# Patient Record
Sex: Female | Born: 1995 | Race: White | Hispanic: No | Marital: Single | Smoking: Never smoker
Health system: Southern US, Community
[De-identification: ages and names within clinical notes are randomized; demographics above are authoritative.]

## PROBLEM LIST (undated history)

## (undated) ENCOUNTER — Inpatient Hospital Stay: Payer: Self-pay

## (undated) DIAGNOSIS — F329 Major depressive disorder, single episode, unspecified: Secondary | ICD-10-CM

## (undated) DIAGNOSIS — D580 Hereditary spherocytosis: Secondary | ICD-10-CM

## (undated) DIAGNOSIS — O1490 Unspecified pre-eclampsia, unspecified trimester: Secondary | ICD-10-CM

## (undated) DIAGNOSIS — F32A Depression, unspecified: Secondary | ICD-10-CM

## (undated) HISTORY — DX: Unspecified pre-eclampsia, unspecified trimester: O14.90

## (undated) HISTORY — PX: SPLENECTOMY, TOTAL: SHX788

---
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
## 2017-01-22 ENCOUNTER — Emergency Department: Payer: Medicaid Other

## 2017-01-22 ENCOUNTER — Emergency Department
Admission: EM | Admit: 2017-01-22 | Discharge: 2017-01-22 | Disposition: A | Discharge status: Home / Self Care | Payer: Medicaid Other | Attending: Emergency Medicine | Admitting: Emergency Medicine

## 2017-01-22 ENCOUNTER — Encounter: Payer: Self-pay | Admitting: *Deleted

## 2017-01-22 DIAGNOSIS — S5012XA Contusion of left forearm, initial encounter: Secondary | ICD-10-CM

## 2017-01-22 DIAGNOSIS — Y929 Unspecified place or not applicable: Secondary | ICD-10-CM | POA: Diagnosis not present

## 2017-01-22 DIAGNOSIS — S50812A Abrasion of left forearm, initial encounter: Secondary | ICD-10-CM

## 2017-01-22 DIAGNOSIS — S4992XA Unspecified injury of left shoulder and upper arm, initial encounter: Secondary | ICD-10-CM | POA: Diagnosis present

## 2017-01-22 DIAGNOSIS — Y998 Other external cause status: Secondary | ICD-10-CM | POA: Diagnosis not present

## 2017-01-22 DIAGNOSIS — Z23 Encounter for immunization: Secondary | ICD-10-CM | POA: Diagnosis not present

## 2017-01-22 DIAGNOSIS — Z793 Long term (current) use of hormonal contraceptives: Secondary | ICD-10-CM | POA: Insufficient documentation

## 2017-01-22 DIAGNOSIS — Y9389 Activity, other specified: Secondary | ICD-10-CM | POA: Diagnosis not present

## 2017-01-22 DIAGNOSIS — S40012A Contusion of left shoulder, initial encounter: Secondary | ICD-10-CM | POA: Diagnosis not present

## 2017-01-22 LAB — POCT PREGNANCY, URINE: Preg Test, Ur: NEGATIVE

## 2017-01-22 MED ORDER — TETANUS-DIPHTH-ACELL PERTUSSIS 5-2.5-18.5 LF-MCG/0.5 IM SUSP
0.5000 mL | Freq: Once | INTRAMUSCULAR | Status: AC
  Administered 2017-01-22: 0.5 mL via INTRAMUSCULAR
  Filled 2017-01-22: qty 0.5

## 2017-01-22 MED ORDER — HYDROCODONE-ACETAMINOPHEN 5-325 MG PO TABS: 1.0000 | ORAL_TABLET | ORAL | 0 refills | Status: DC | PRN

## 2017-01-22 MED ORDER — HYDROCODONE-ACETAMINOPHEN 5-325 MG PO TABS
2.0000 | ORAL_TABLET | Freq: Once | ORAL | Status: AC
  Administered 2017-01-22: 2 via ORAL
  Filled 2017-01-22: qty 2

## 2017-01-22 MED ORDER — BACITRACIN-NEOMYCIN-POLYMYXIN 400-5-5000 EX OINT
TOPICAL_OINTMENT | CUTANEOUS | Status: AC
  Filled 2017-01-22: qty 1

## 2017-01-22 MED ORDER — IBUPROFEN 600 MG PO TABS: 600.0000 mg | ORAL_TABLET | Freq: Three times a day (TID) | ORAL | 0 refills | Status: DC | PRN
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
## 2017-01-22 NOTE — Discharge Instructions (Signed)
Clean abrasions daily with mild soap and water. Watch for signs of infection. Ice to the forearm and shoulder as needed for pain and any soft tissue swelling. Ibuprofen as needed for inflammation and pain. Norco if needed for moderate pain. [facility] if any continued problems.

## 2017-05-19 ENCOUNTER — Encounter: Payer: Self-pay | Admitting: Emergency Medicine

## 2017-05-19 ENCOUNTER — Other Ambulatory Visit: Payer: Self-pay

## 2017-05-19 ENCOUNTER — Emergency Department
Admission: EM | Admit: 2017-05-19 | Discharge: 2017-05-19 | Disposition: A | Discharge status: Home / Self Care | Payer: Medicaid Other | Attending: Emergency Medicine | Admitting: Emergency Medicine

## 2017-05-19 DIAGNOSIS — J029 Acute pharyngitis, unspecified: Secondary | ICD-10-CM | POA: Diagnosis not present

## 2017-05-19 DIAGNOSIS — G44039 Episodic paroxysmal hemicrania, not intractable: Secondary | ICD-10-CM

## 2017-05-19 DIAGNOSIS — Z79899 Other long term (current) drug therapy: Secondary | ICD-10-CM | POA: Diagnosis not present

## 2017-05-19 DIAGNOSIS — G44049 Chronic paroxysmal hemicrania, not intractable: Secondary | ICD-10-CM | POA: Insufficient documentation

## 2017-05-19 DIAGNOSIS — R509 Fever, unspecified: Secondary | ICD-10-CM | POA: Insufficient documentation

## 2017-05-19 DIAGNOSIS — R51 Headache: Secondary | ICD-10-CM | POA: Diagnosis present

## 2017-05-19 LAB — BASIC METABOLIC PANEL
Anion gap: 6 (ref 5–15)
BUN: 10 mg/dL (ref 6–20)
CALCIUM: 8.5 mg/dL — AB (ref 8.9–10.3)
CO2: 25 mmol/L (ref 22–32)
CREATININE: 0.54 mg/dL (ref 0.44–1.00)
Chloride: 105 mmol/L (ref 101–111)
Glucose, Bld: 124 mg/dL — ABNORMAL HIGH (ref 65–99)
Potassium: 3.2 mmol/L — ABNORMAL LOW (ref 3.5–5.1)
SODIUM: 136 mmol/L (ref 135–145)

## 2017-05-19 LAB — CBC
HEMATOCRIT: 32.6 % — AB (ref 35.0–47.0)
Hemoglobin: 11.4 g/dL — ABNORMAL LOW (ref 12.0–16.0)
MCH: 29.1 pg (ref 26.0–34.0)
MCHC: 34.9 g/dL (ref 32.0–36.0)
MCV: 83.4 fL (ref 80.0–100.0)
PLATELETS: 468 10*3/uL — AB (ref 150–440)
RBC: 3.91 MIL/uL (ref 3.80–5.20)
RDW: 13.3 % (ref 11.5–14.5)
WBC: 24.5 10*3/uL — AB (ref 3.6–11.0)

## 2017-05-19 LAB — POCT RAPID STREP A: STREPTOCOCCUS, GROUP A SCREEN (DIRECT): NEGATIVE

## 2017-05-19 LAB — MONONUCLEOSIS SCREEN: MONO SCREEN: NEGATIVE

## 2017-05-19 MED ORDER — ACETAMINOPHEN 500 MG PO TABS
1000.0000 mg | ORAL_TABLET | Freq: Once | ORAL | Status: AC
  Administered 2017-05-19: 1000 mg via ORAL
  Filled 2017-05-19: qty 2

## 2017-05-19 MED ORDER — AMOXICILLIN 875 MG PO TABS: 875.0000 mg | ORAL_TABLET | Freq: Two times a day (BID) | ORAL | 0 refills | Status: DC

## 2017-05-19 MED ORDER — SODIUM CHLORIDE 0.9 % IV BOLUS (SEPSIS)
1000.0000 mL | Freq: Once | INTRAVENOUS | Status: AC
  Administered 2017-05-19: 1000 mL via INTRAVENOUS

## 2017-05-19 MED ORDER — CEFTRIAXONE SODIUM IN DEXTROSE 20 MG/ML IV SOLN
1.0000 g | Freq: Once | INTRAVENOUS | Status: AC
  Administered 2017-05-19: 1 g via INTRAVENOUS
  Filled 2017-05-19: qty 50

## 2017-05-19 NOTE — ED Triage Notes (Addendum)
Patient to ER for c/o severe headache to right posterior head that started 3 days ago. Patient reports she has never been diagnosed with migraines, but had headaches a lot as a kid. Patient reports taking Dayquil and IBU last at 1900. Patient states she is unaware if she has had fevers at home. States she has been taking several OTC medications with no relief of headache. Denies photosensitivity, but reports sensitivity to noise. Last dose of Tylenol was at 0500.

## 2017-05-19 NOTE — Discharge Instructions (Signed)
PLease follow up with your primary care physician °

## 2017-05-19 NOTE — ED Notes (Signed)
Pt alert and oriented X4, active, cooperative, pt in NAD. RR even and unlabored, color WNL.  Pt informed to return if any life threatening symptoms occur.  Discharge and followup instructions reviewed.  

## 2017-05-19 NOTE — ED Provider Notes (Signed)
[facility] Emergency Department Provider Note   ____________________________________________   First MD Initiated Contact with Patient 05/19/17 [phone]     (approximate)  I have reviewed the triage vital signs and the nursing notes.   HISTORY  Chief Complaint Headache    HPI Rebecca Combs is a 21 y.o. female who comes into the hospital today with some right-sided head pain and sore throat. She reports that she doesn't have a stuffy nose. The patient reports that she's been taking ibuprofen and DayQuil but it has not gotten any better. The patient states that she's had these symptoms for the past 3 days. The sharp pain lasts about 30 seconds and comes back every minute or so when she is awake. The patient has never had this before. She denies any neck pain or any other headache. She's had some nausea with no vomiting and denies sick contacts. The patient denies fever at home but states that she was told she had a fever when she arrived here. She states her pain is about a 6 out of 10 in intensity. The patient was unsure what was going on so she decided to come into the hospital for evaluation.   Past Medical History:  Diagnosis Date  . Depression   . Spherocytosis (HCC)   . 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 Postpartum care following vaginal delivery 05/15/2016  . Indication for care in labor or delivery 05/13/2016  . Pregnancy 05/11/2016  . Vaginal discharge 04/28/2016  . Labor and delivery indication for care or intervention 04/21/2016  . Abdominal pain affecting pregnancy 03/07/2016  . ODD (oppositional defiant disorder) 08/18/2013  . Cannabis abuse 08/18/2013  . MDD (major depressive disorder), recurrent episode, moderate (HCC) 08/17/2013    Past Surgical History:  Procedure Laterality Date  . SPLENECTOMY, TOTAL      Prior to Admission medications     Medication Sig Start Date End Date Taking? Authorizing Provider  amoxicillin (AMOXIL) 875 MG tablet Take 1 tablet (875 mg total) 2 (two) times daily by mouth. 05/19/17   Rebecka ApleyWebster, Cannon Arreola P, MD  HYDROcodone-acetaminophen (NORCO/VICODIN) 5-325 MG tablet Take 1 tablet by mouth every 4 (four) hours as needed for moderate pain. 01/22/17   Tommi RumpsSummers, Rhonda L, PA-C  ibuprofen (ADVIL,MOTRIN) 600 MG tablet Take 1 tablet (600 mg total) by mouth every 8 (eight) hours as needed. 01/22/17   Tommi RumpsSummers, Rhonda L, PA-C  Multiple Vitamin (MULTIVITAMIN) tablet Take 1 tablet by mouth daily.    [provider]  norethindrone-ethinyl estradiol (JUNEL FE 1/20) 1-20 MG-MCG tablet Take 1 tablet by mouth daily. 05/25/16   Tresea MallGledhill, Jane, CNM    Allergies Patient has no known allergies.  Family History  Problem Relation Age of Onset  . Hereditary spherocytosis Mother     Social History Social History   Tobacco Use  . Smoking status: Never Smoker  . Smokeless tobacco: Never Used  Substance Use Topics  . Alcohol use: No  . Drug use: Yes    Types: Marijuana    Comment: last use use a month or two ago    Review of Systems  Constitutional: fever Eyes: No visual changes. ENT:  sore throat. Cardiovascular: Denies chest pain. Respiratory: Denies shortness of breath. Gastrointestinal: Nausea with No abdominal pain.  no vomiting.  No diarrhea.  No constipation. Genitourinary: Negative for dysuria. Musculoskeletal: Negative for back pain. Skin: Negative for  rash. Neurological: Head pain   ____________________________________________   PHYSICAL EXAM:  VITAL SIGNS: ED Triage Vitals [05/19/17 0524]  Enc Vitals Group     BP (!) 97/36     Pulse Rate (!) 114     Resp 18     Temp (!) 100.9 F (38.3 C)     Temp Source Oral     SpO2 96 %     Weight 130 lb (59 kg)     Height 5\' 1"  (1.549 m)     Head Circumference      Peak Flow      Pain Score 9     Pain Loc      Pain Edu?      Excl. in GC?      Constitutional: Alert and oriented. Well appearing and in mild distress. Eyes: Conjunctivae are normal. PERRL. EOMI. Head: Atraumatic. Nose: No congestion/rhinnorhea. Mouth/Throat: Mucous membranes are moist.  Oropharynx erythematous, with left-sided exudate Neck: Supple with no meningeal signs Cardiovascular: Tachycardia, regular rhythm. Grossly normal heart sounds.  Good peripheral circulation. Respiratory: Normal respiratory effort.  No retractions. Lungs CTAB. Gastrointestinal: Soft and nontender. No distention. Positive bowel sounds Musculoskeletal: No lower extremity tenderness nor edema.   Neurologic:  Normal speech and language.  Skin:  Skin is warm, dry and intact.  Psychiatric: Mood and affect are normal.   ____________________________________________   LABS (all labs ordered are listed, but only abnormal results are displayed)  Labs Reviewed  CBC - Abnormal; Notable for the following components:      Result Value   WBC 24.5 (*)    Hemoglobin 11.4 (*)    HCT 32.6 (*)    Platelets 468 (*)    All other components within normal limits  BASIC METABOLIC PANEL - Abnormal; Notable for the following components:   Potassium 3.2 (*)    Glucose, Bld 124 (*)    Calcium 8.5 (*)    All other components within normal limits  CULTURE, GROUP A STREP [facility]  MONONUCLEOSIS SCREEN  POCT RAPID STREP A   ____________________________________________  EKG  None ____________________________________________  RADIOLOGY  No results found.  ____________________________________________   PROCEDURES  Procedure(s) performed: None  Procedures  Critical Care performed: No  ____________________________________________   INITIAL IMPRESSION / ASSESSMENT AND PLAN / ED COURSE  As part of my medical decision making, I reviewed the following data within the electronic MEDICAL RECORD NUMBER Notes from prior ED visits and [place] Controlled Substance Database   This is a 21 year old  female who comes into the hospital today with some fever and sore throat and headache  My differential diagnosis includes strep throat, migraine, viral illness.  Given the patient's fever and her history of splenectomy had to check some blood work. The patient's white blood cell count came back at 24.5. Upon reviewing the patient's previous blood work reports her white blood cell count has trended in the 20,000 range. The patient strep and mono are negative. Given the patient's history as well as her tonsillar exudate and erythema I feel that I will still treat her with some ceftriaxone and treat her with amoxicillin for home. The patient should follow-up with her primary care physician. Otherwise the patient's heart rate is improved.     She will be discharged once her medication is complete ____________________________________________   FINAL CLINICAL IMPRESSION(S) / ED DIAGNOSES  Final diagnoses:  Pharyngitis, unspecified etiology  Fever, unspecified fever cause  Nonintractable paroxysmal hemicrania, unspecified chronicity pattern  Note:  This document was prepared using Dragon voice recognition software and may include unintentional dictation errors.    Rebecka Apley, MD 05/19/17 0830

## 2017-05-20 ENCOUNTER — Emergency Department
Admission: EM | Admit: 2017-05-20 | Discharge: 2017-05-20 | Disposition: A | Discharge status: Home / Self Care | Payer: Medicaid Other | Attending: Emergency Medicine | Admitting: Emergency Medicine

## 2017-05-20 ENCOUNTER — Encounter: Payer: Self-pay | Admitting: *Deleted

## 2017-05-20 ENCOUNTER — Emergency Department: Payer: Medicaid Other

## 2017-05-20 DIAGNOSIS — D580 Hereditary spherocytosis: Secondary | ICD-10-CM | POA: Diagnosis not present

## 2017-05-20 DIAGNOSIS — Y929 Unspecified place or not applicable: Secondary | ICD-10-CM | POA: Diagnosis not present

## 2017-05-20 DIAGNOSIS — Y939 Activity, unspecified: Secondary | ICD-10-CM | POA: Insufficient documentation

## 2017-05-20 DIAGNOSIS — Z9081 Acquired absence of spleen: Secondary | ICD-10-CM | POA: Insufficient documentation

## 2017-05-20 DIAGNOSIS — Z79899 Other long term (current) drug therapy: Secondary | ICD-10-CM | POA: Insufficient documentation

## 2017-05-20 DIAGNOSIS — Y999 Unspecified external cause status: Secondary | ICD-10-CM | POA: Diagnosis not present

## 2017-05-20 DIAGNOSIS — S0240CB Maxillary fracture, right side, initial encounter for open fracture: Secondary | ICD-10-CM | POA: Insufficient documentation

## 2017-05-20 DIAGNOSIS — S0993XA Unspecified injury of face, initial encounter: Secondary | ICD-10-CM | POA: Diagnosis present

## 2017-05-20 MED ORDER — AMOXICILLIN-POT CLAVULANATE 875-125 MG PO TABS: 1.0000 | ORAL_TABLET | Freq: Two times a day (BID) | ORAL | 0 refills | Status: DC

## 2017-05-20 MED ORDER — TRAMADOL HCL 50 MG PO TABS: 50.0000 mg | ORAL_TABLET | Freq: Four times a day (QID) | ORAL | 0 refills | Status: DC | PRN

## 2017-05-20 MED ORDER — AMOXICILLIN-POT CLAVULANATE 875-125 MG PO TABS
1.0000 | ORAL_TABLET | Freq: Once | ORAL | Status: AC
  Administered 2017-05-20: 1 via ORAL
  Filled 2017-05-20: qty 1

## 2017-05-20 MED ORDER — CLINDAMYCIN PHOSPHATE 600 MG/4ML IJ SOLN
600.0000 mg | Freq: Once | INTRAMUSCULAR | Status: AC
  Administered 2017-05-20: 600 mg via INTRAMUSCULAR
  Filled 2017-05-20: qty 4

## 2017-05-20 MED ORDER — CLINDAMYCIN HCL 300 MG PO CAPS: 300.0000 mg | ORAL_CAPSULE | Freq: Four times a day (QID) | ORAL | 0 refills | Status: DC

## 2017-05-20 NOTE — ED Notes (Signed)
Pharmacy emailed to send Cleocin
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
## 2017-05-21 LAB — CULTURE, GROUP A STREP (THRC)

## 2018-03-05 LAB — HM HIV SCREENING LAB: HM HIV Screening: NEGATIVE

## 2019-04-20 ENCOUNTER — Other Ambulatory Visit: Payer: Self-pay

## 2019-04-20 DIAGNOSIS — Z20822 Contact with and (suspected) exposure to covid-19: Secondary | ICD-10-CM

## 2019-04-22 LAB — NOVEL CORONAVIRUS, NAA: SARS-CoV-2, NAA: NOT DETECTED
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
## 2019-08-12 ENCOUNTER — Ambulatory Visit: Payer: Self-pay

## 2020-01-12 ENCOUNTER — Ambulatory Visit: Payer: Medicaid Other

## 2020-01-12 ENCOUNTER — Ambulatory Visit: Payer: Self-pay

## 2020-01-17 ENCOUNTER — Ambulatory Visit: Payer: Self-pay

## 2020-01-17 ENCOUNTER — Ambulatory Visit: Payer: Medicaid Other

## 2020-02-20 ENCOUNTER — Ambulatory Visit: Payer: Medicaid Other

## 2020-02-22 ENCOUNTER — Other Ambulatory Visit: Payer: Self-pay

## 2020-02-22 ENCOUNTER — Encounter: Payer: Self-pay | Admitting: Family Medicine

## 2020-02-22 ENCOUNTER — Ambulatory Visit (LOCAL_COMMUNITY_HEALTH_CENTER): Payer: Medicaid Other | Admitting: Family Medicine

## 2020-02-22 VITALS — BP 109/69 | Ht 60.0 in | Wt 143.0 lb

## 2020-02-22 DIAGNOSIS — Z3040 Encounter for surveillance of contraceptives, unspecified: Secondary | ICD-10-CM

## 2020-02-22 DIAGNOSIS — Z3009 Encounter for other general counseling and advice on contraception: Secondary | ICD-10-CM

## 2020-02-22 NOTE — Progress Notes (Signed)
Family Planning Visit- Repeat Yearly Visit  Subjective:  Rebecca Combs is a 24 y.o. [phone]  being seen today for an well woman visit and to discuss family planning options.    She is currently using IUD Skyla for pregnancy prevention. Patient reports she does if she or her partner wants a pregnancy in the next year. Patient  has MDD (major depressive disorder), recurrent episode, moderate (HCC); ODD (oppositional defiant disorder); Cannabis abuse; Abdominal pain affecting pregnancy; Labor and delivery indication for care or intervention; Vaginal discharge; Pregnancy; Indication for care in labor or delivery; and Postpartum care following vaginal delivery on their problem list.  Chief Complaint  Patient presents with  . Contraception    Physical    Patient reports she is here for her well-woman exam and Her Skyla IUD f/u.  States that she locates her iud strings monthly.  States that her partner states that sometimes he can feel the IUD strings.  Patient denies other concens  See flowsheet for other program required questions.   Body mass index is 27.93 kg/m. - Patient is eligible for diabetes screening based on BMI and age >57?  not applicable HA1C ordered? not applicable  Patient reports 2 of partners in last year. Desires STI screening?  Yes   Has patient been screened once for HCV in the past?  No  No results found for: HCVAB  Does the patient have current of drug use, have a partner with drug use, and/or has been incarcerated since last result? No  If yes-- Screen for HCV through [facility]   Does the patient meet criteria for HBV testing? No  Criteria:  -Household, sexual or needle sharing contact with HBV -History of drug use -HIV positive -Those with known Hep C   Health Maintenance Due  Topic Date Due  . Hepatitis C Screening  Never done  . CHLAMYDIA SCREENING  08/19/2014  . PAP-Cervical Cytology Screening  Never done  . PAP SMEAR-Modifier  Never done   . INFLUENZA VACCINE  02/12/2020    Review of Systems  All other systems reviewed and are negative.   The following portions of the patient's history were reviewed and updated as appropriate: allergies, current medications, past family history, past medical history, past social history, past surgical history and problem list. Problem list updated.  Objective:   Vitals:   02/22/20 1504  BP: 109/69  Weight: 143 lb (64.9 kg)  Height: 5' (1.524 m)    Physical Exam Vitals and nursing note reviewed.  Constitutional:      Appearance: Normal appearance.  HENT:     Head: Normocephalic.  Pulmonary:     Effort: Pulmonary effort is normal.  Genitourinary:    General: Normal vulva.     Pubic Area: No rash.      Labia:        Right: No rash, tenderness, lesion or injury.        Left: No rash, tenderness, lesion or injury.      Vagina: Normal. No vaginal discharge.     Cervix: Normal.     Comments: Bimanual exam not indicated Musculoskeletal:        General: Normal range of motion.  Lymphadenopathy:     Lower Body: No right inguinal adenopathy. No left inguinal adenopathy.  Skin:    General: Skin is warm and dry.  Neurological:     Mental Status: She is alert. Mental status is at baseline.       Assessment and  Plan:  Rebecca Combs is a 24 y.o. female [phone] presenting to the [facility] Department for an yearly well woman exam/family planning visit  Contraception counseling: Reviewed all forms of birth control options in the tiered based approach. available including abstinence; over the counter/barrier methods; hormonal contraceptive medication including pill, patch, ring, injection,contraceptive implant, ECP; hormonal and nonhormonal IUDs; permanent sterilization options including vasectomy and the various tubal sterilization modalities. Risks, benefits, and typical effectiveness rates were reviewed.  Questions were answered.  Written information was also  given to the patient to review.  Patient desires to continue with her Skyla IUD. She will follow up in 1 year or PRN for surveillance.  She was told to call with any further questions, or with any concerns about this method of contraception.  Emphasized use of condoms 100% of the time for STI prevention.  Patient was not an ECP candidate.   1. Family planning  - Chlamydia/Gonorrhea [facility] - Syphilis Serology, [place] [facility] - HIV [facility] - Discussed with client that when has IUD removed 2022 can change her IUD to the Mirena. Client interested in the longer use of this IUD.  2. Encounter for surveillance of contraceptive device IUD strings visualized.  Encouraged client to continue to check strings. Co. To use condoms for STD prevention.     No follow-ups on file.  No future appointments.  Larene Pickett, FNP

## 2020-02-22 NOTE — Progress Notes (Signed)
Here today for PE. Last PE here was 03/05/2018. No Pap Smear records here. IUD inserted 04/06/2018. Satisfied with current birth control. Wants all STD screening. Tawny Hopping, RN

## 2020-03-02 ENCOUNTER — Telehealth: Payer: Self-pay | Admitting: Family Medicine

## 2020-03-02 NOTE — Telephone Encounter (Signed)
Phone call to pt. Pt verified password from last visit. Pt counseled about STD results from last visit. Per pt request sent reset/activation for My Chart.

## 2020-03-02 NOTE — Telephone Encounter (Signed)
Patient wants to talk with about regarding last visit.

## 2020-09-07 ENCOUNTER — Other Ambulatory Visit: Payer: Self-pay

## 2020-09-07 ENCOUNTER — Emergency Department
Admission: EM | Admit: 2020-09-07 | Discharge: 2020-09-07 | Disposition: A | Discharge status: Home / Self Care | Payer: Medicaid Other | Attending: Emergency Medicine | Admitting: Emergency Medicine

## 2020-09-07 DIAGNOSIS — N390 Urinary tract infection, site not specified: Secondary | ICD-10-CM | POA: Insufficient documentation

## 2020-09-07 DIAGNOSIS — B9689 Other specified bacterial agents as the cause of diseases classified elsewhere: Secondary | ICD-10-CM | POA: Diagnosis not present

## 2020-09-07 DIAGNOSIS — N898 Other specified noninflammatory disorders of vagina: Secondary | ICD-10-CM | POA: Diagnosis present

## 2020-09-07 LAB — URINALYSIS, COMPLETE (UACMP) WITH MICROSCOPIC
Glucose, UA: NEGATIVE mg/dL
Nitrite: NEGATIVE
Protein, ur: 30 mg/dL — AB
Specific Gravity, Urine: >1.03 — ABNORMAL HIGH (ref 1.005–1.030)
Squamous Epithelial / LPF: >50 (ref 0–5)
pH: 6.5 (ref 5.0–8.0)

## 2020-09-07 LAB — POC URINE PREG, ED: Preg Test, Ur: NEGATIVE

## 2020-09-07 LAB — CHLAMYDIA/NGC RT PCR (ARMC ONLY)
Chlamydia Tr: NOT DETECTED
N gonorrhoeae: NOT DETECTED

## 2020-09-07 LAB — WET PREP, GENITAL
Clue Cells Wet Prep HPF POC: NONE SEEN
Sperm: NONE SEEN
Trich, Wet Prep: NONE SEEN
Yeast Wet Prep HPF POC: NONE SEEN

## 2020-09-07 MED ORDER — NITROFURANTOIN MONOHYD MACRO 100 MG PO CAPS: 100.0000 mg | ORAL_CAPSULE | Freq: Two times a day (BID) | ORAL | 0 refills | Status: AC

## 2020-09-07 NOTE — ED Provider Notes (Signed)
[facility] Emergency Department Provider Note   ____________________________________________   Event Date/Time   First MD Initiated Contact with Patient 09/07/20 1033     (approximate)  I have reviewed the triage vital signs and the nursing notes.   HISTORY  Chief Complaint Exposure to STD    HPI Rebecca Combs is a 25 y.o. female presents to the ED with complaint of vaginal itching and a yellow-colored discharge that happened for 1 day 1 week ago.  Patient is requesting tests for STDs as she went to the health department and there were no appointments for 1 month.  Patient's partner is also here to be tested as well.  Patient denies any abdominal pain, fever or chills.         Past Medical History:  Diagnosis Date  . Depression   . Preeclampsia   . Spherocytosis (HCC)   . 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 ODD (oppositional defiant disorder) 08/18/2013  . Cannabis abuse 08/18/2013  . MDD (major depressive disorder), recurrent episode, moderate (HCC) 08/17/2013    Past Surgical History:  Procedure Laterality Date  . SPLENECTOMY, TOTAL      Prior to Admission medications   Medication Sig Start Date End Date Taking? Authorizing Provider  nitrofurantoin, macrocrystal-monohydrate, (MACROBID) 100 MG capsule Take 1 capsule (100 mg total) by mouth 2 (two) times daily for 7 days. 09/07/20 09/14/20 Yes Tommi Rumps, PA-C  Levonorgestrel (SKYLA) 13.5 MG IUD by Intrauterine route. 04/06/18   Alberteen Spindle, CNM    Allergies Patient has no known allergies.  Family History  Problem Relation Age of Onset  . Hereditary spherocytosis Mother   . Migraines Mother   . Hypertension Maternal Grandmother   . Migraines Maternal Grandmother   . Cancer Maternal Grandmother   . Hypertension Maternal Grandfather   . Diabetes Maternal Grandfather      Social History Social History   Tobacco Use  . Smoking status: Never Smoker  . Smokeless tobacco: Never Used  Substance Use Topics  . Alcohol use: Yes    Comment: Ocassional  . Drug use: Yes    Types: Marijuana    Comment: last use use a month or two ago    Review of Systems Constitutional: No fever/chills Eyes: No visual changes. ENT: No sore throat. Cardiovascular: Denies chest pain. Respiratory: Denies shortness of breath. Gastrointestinal: No abdominal pain.  No nausea, no vomiting.  No diarrhea. Genitourinary: Negative for dysuria.  Positive for resolved vaginal discharge. Musculoskeletal: Negative for musculoskeletal pain. Skin: Negative for rash. Neurological: Negative for headaches, focal weakness or numbness. ____________________________________________   PHYSICAL EXAM:  VITAL SIGNS: ED Triage Vitals  Enc Vitals Group     BP 09/07/20 1010 131/74     Pulse Rate 09/07/20 1010 (!) 140     Resp 09/07/20 1010 20     Temp 09/07/20 1010 98.6 F (37 C)     Temp Source 09/07/20 1010 Oral     SpO2 09/07/20 1010 97 %     Weight 09/07/20 1010 170 lb (77.1 kg)     Height 09/07/20 1010 5\' 1"  (1.549 m)     Head Circumference --      Peak Flow --      Pain Score 09/07/20 1020 0     Pain Loc --      Pain Edu? --  Excl. in GC? --     Constitutional: Alert and oriented. Well appearing and in no acute distress. Eyes: Conjunctivae are normal.  Head: Atraumatic. Neck: No stridor.   Cardiovascular: Normal rate, regular rhythm. Grossly normal heart sounds.  Good peripheral circulation. Respiratory: Normal respiratory effort.  No retractions. Lungs CTAB. Gastrointestinal: Soft and nontender. No distention.  Genitourinary: External genitalia is unremarkable.  There is some white exudate along the vaginal walls and cervix.  Cultures were obtained.  No adnexal masses or tenderness is noted.  No cervical motion tenderness present. Musculoskeletal: Moves upper and lower  extremities any difficulty.  Normal gait was noted. Neurologic:  Normal speech and language. No gross focal neurologic deficits are appreciated. No gait instability. Skin:  Skin is warm, dry and intact. No rash noted. Psychiatric: Mood and affect are normal. Speech and behavior are normal.  ____________________________________________   LABS (all labs ordered are listed, but only abnormal results are displayed)  Labs Reviewed  WET PREP, GENITAL - Abnormal; Notable for the following components:      Result Value   WBC, Wet Prep HPF POC FEW (*)    All other components within normal limits  URINALYSIS, COMPLETE (UACMP) WITH MICROSCOPIC - Abnormal; Notable for the following components:   APPearance CLOUDY (*)    Specific Gravity, Urine >1.030 (*)    Hgb urine dipstick MODERATE (*)    Bilirubin Urine SMALL (*)    Ketones, ur TRACE (*)    Protein, ur 30 (*)    Leukocytes,Ua SMALL (*)    Bacteria, UA RARE (*)    All other components within normal limits  CHLAMYDIA/NGC RT PCR ([facility] ONLY)  POC URINE PREG, ED     PROCEDURES  Procedure(s) performed (including Critical Care):  Procedures   ____________________________________________   INITIAL IMPRESSION / ASSESSMENT AND PLAN / ED COURSE  As part of my medical decision making, I reviewed the following data within the electronic MEDICAL RECORD NUMBER Notes from prior ED visits and [place] Controlled Substance Database  25 year old female presents to the ED with complaint of vaginal discharge once 1 week ago.  Patient is highly anxious that she has an STD and also has her sexual partner here to be tested as well.  Wet prep was negative and at the time of discharge patient's GC and Chlamydia test were still pending.  The partners test were negative.  Urinalysis is suspicious for a urinary tract infection when asked patient states that she has had some mild dysuria that she did not disclose initially.  Patient was given a prescription for  Macrobid 100 mg twice daily for 7 days.  She is to increase fluids.  She is encouraged to follow-up with her PCP if any continued problems.  ____________________________________________   FINAL CLINICAL IMPRESSION(S) / ED DIAGNOSES  Final diagnoses:  Acute urinary tract infection     ED Discharge Orders         Ordered    nitrofurantoin, macrocrystal-monohydrate, (MACROBID) 100 MG capsule  2 times daily        09/07/20 1234          *Please note:  Sundae Maners was evaluated in Emergency Department on 09/07/2020 for the symptoms described in the history of present illness. She was evaluated in the context of the global COVID-19 pandemic, which necessitated consideration that the patient might be at risk for infection with the SARS-CoV-2 virus that causes COVID-19. Institutional protocols and algorithms that pertain to the evaluation of patients at  risk for COVID-19 are in a state of rapid change based on information released by regulatory bodies including the CDC and federal and state organizations. These policies and algorithms were followed during the patient's care in the ED.  Some ED evaluations and interventions may be delayed as a result of limited staffing during and the pandemic.*   Note:  This document was prepared using Dragon voice recognition software and may include unintentional dictation errors.    Tommi Rumps, PA-C 09/07/20 1301    Gilles Chiquito, MD 09/07/20 1524

## 2020-09-07 NOTE — Discharge Instructions (Addendum)
Follow-up with your primary care provider if any continued problems or concerns.  The antibiotic was sent to your pharmacy take twice a day for the next 7 days.  Also discontinue using the soap that you are currently using to see if this helps with any of your symptoms.

## 2020-09-07 NOTE — ED Notes (Signed)
See triage note  Presents with some vaginal itching and slight abd discomfort  Denies any dysuria

## 2020-09-07 NOTE — ED Triage Notes (Addendum)
Pt c/o yellow colored discharge with some pain when bathing for the past several days and is wanting to be checked for STD.Marland Kitchen Pt states her HR is up because she is nervous.

## 2021-12-31 ENCOUNTER — Encounter: Payer: Self-pay | Admitting: Family Medicine

## 2021-12-31 ENCOUNTER — Ambulatory Visit (LOCAL_COMMUNITY_HEALTH_CENTER): Payer: Medicaid Other | Admitting: Family Medicine

## 2021-12-31 ENCOUNTER — Ambulatory Visit: Payer: Medicaid Other

## 2021-12-31 VITALS — BP 111/77 | Ht 60.0 in | Wt 142.0 lb

## 2021-12-31 DIAGNOSIS — Z124 Encounter for screening for malignant neoplasm of cervix: Secondary | ICD-10-CM

## 2021-12-31 DIAGNOSIS — Z309 Encounter for contraceptive management, unspecified: Secondary | ICD-10-CM | POA: Diagnosis not present

## 2021-12-31 DIAGNOSIS — Z3009 Encounter for other general counseling and advice on contraception: Secondary | ICD-10-CM

## 2021-12-31 DIAGNOSIS — Z01419 Encounter for gynecological examination (general) (routine) without abnormal findings: Secondary | ICD-10-CM

## 2021-12-31 DIAGNOSIS — Z30432 Encounter for removal of intrauterine contraceptive device: Secondary | ICD-10-CM | POA: Diagnosis not present

## 2021-12-31 DIAGNOSIS — Z30430 Encounter for insertion of intrauterine contraceptive device: Secondary | ICD-10-CM | POA: Diagnosis not present

## 2021-12-31 DIAGNOSIS — N760 Acute vaginitis: Secondary | ICD-10-CM

## 2021-12-31 DIAGNOSIS — Z113 Encounter for screening for infections with a predominantly sexual mode of transmission: Secondary | ICD-10-CM

## 2021-12-31 LAB — WET PREP FOR TRICH, YEAST, CLUE
Trichomonas Exam: NEGATIVE
Yeast Exam: NEGATIVE

## 2021-12-31 MED ORDER — LEVONORGESTREL 20.1 MCG/DAY IU IUD
1.0000 | INTRAUTERINE_SYSTEM | Freq: Once | INTRAUTERINE | Status: AC
  Administered 2021-12-31: 1 via INTRAUTERINE

## 2021-12-31 MED ORDER — METRONIDAZOLE 500 MG PO TABS: 500.0000 mg | ORAL_TABLET | Freq: Two times a day (BID) | ORAL | 0 refills | Status: AC

## 2021-12-31 NOTE — Progress Notes (Unsigned)
Pt here for PE, Pap, IUD removal and reinsertion.  Wet mount results reviewed.  IUD removed and Liletta inserted by Provider.  FP packet given.  The patient was dispensed Metronidazole 500 mg today. I provided counseling today regarding the medication. We discussed the medication, the side effects and when to call clinic. Patient given the opportunity to ask questions. Questions answered.

## 2021-12-31 NOTE — Progress Notes (Incomplete)
[facility] DEPARTMENT [facility] [address] Main Number: [phone]    Family Planning Visit- Initial Visit  Subjective:  Raymonde Hamblin is a 26 y.o.  [id number]   being seen today for an initial annual visit and to discuss reproductive life planning.  The patient is currently using IUD or IUS for pregnancy prevention. Patient reports she/her/hers  does not want a pregnancy in the next year.    she/her/hers report they are looking for a method that provides High efficacy at preventing pregnancy and Minimal bleeding/improved bleeding profile  Patient has the following medical conditions has MDD (major depressive disorder), recurrent episode, moderate (HCC); ODD (oppositional defiant disorder); and Cannabis abuse on their problem list.  Chief Complaint  Patient presents with  . Contraception    PE, IUD removal and reinsertion    Patient reports skyla placed in 2019. Never had pap smear  Patient denies ***   Body mass index is 27.73 kg/m. - Patient is eligible for diabetes screening based on BMI and age >96?  not applicable HA1C ordered? not applicable  Patient reports 1  partner/s in last year. Desires STI screening?  No - ***  Has patient been screened once for HCV in the past?  {yes/no:20286}  No results found for: "HCVAB"  Does the patient have current drug use (including MJ), have a partner with drug use, and/or has been incarcerated since last result? {yes/no:20286}  If yes-- Screen for HCV through [facility]   Does the patient meet criteria for HBV testing? {yes/no:20286}  Criteria:  -Household, sexual or needle sharing contact with HBV -History of drug use -HIV positive -Those with known Hep C   Health Maintenance Due  Topic Date Due  . Hepatitis C Screening  Never done  . PAP-Cervical Cytology Screening  Never done  . PAP SMEAR-Modifier  Never done  . CHLAMYDIA SCREENING  09/07/2021    Review of Systems   Constitutional:  Negative for chills and fever.  Eyes:  Negative for blurred vision and double vision.  Respiratory:  Negative for cough and shortness of breath.   Cardiovascular:  Negative for chest pain and orthopnea.  Gastrointestinal:  Negative for nausea and vomiting.  Genitourinary:  Negative for dysuria, flank pain and frequency.  Musculoskeletal:  Negative for myalgias.  Skin:  Negative for rash.  Neurological:  Negative for dizziness, tingling, weakness and headaches.  Endo/Heme/Allergies:  Does not bruise/bleed easily.  Psychiatric/Behavioral:  Negative for depression and suicidal ideas. The patient is not nervous/anxious.     The following portions of the patient's history were reviewed and updated as appropriate: allergies, current medications, past family history, past medical history, past social history, past surgical history and problem list. Problem list updated.   See flowsheet for other program required questions.  Objective:   Vitals:   12/31/21 0852  BP: 111/77  Weight: 142 lb (64.4 kg)  Height: 5' (1.524 m)    Physical Exam    Assessment and Plan:  Kerin Brailyn Killion is a 26 y.o. female presenting to the [facility] Department for an initial annual wellness/contraceptive visit  Contraception counseling: Reviewed options based on patient desire and reproductive life plan. Patient is interested in {[employer] Methods:24109}. This {WAS/WAS NOT:[phone]::"was not"} provided to the patient today. *** if not why not clearly documented  Risks, benefits, and typical effectiveness rates were reviewed.  Questions were answered.  Written information was also given to the patient to review.  The patient will follow up in  {NUMBER 1-10:22536} {days/wks/mos/yrs:310907} for surveillance.  The patient was told to call with any further questions, or with any concerns about this method of contraception.  Emphasized use of condoms 100% of the time for  STI prevention.  Need for ECP was assessed. Patient reported {unprotected sex categories:26659}.  Reviewed options and patient desired {ECP options:27263}   There are no diagnoses linked to this encounter.    No follow-ups on file.  No future appointments.  Federico Flake, MD

## 2022-01-06 ENCOUNTER — Encounter: Payer: Self-pay | Admitting: Family Medicine

## 2022-01-10 LAB — IGP, RFX APTIMA HPV ASCU: PAP Smear Comment: 0

## 2022-01-10 LAB — HPV APTIMA: HPV Aptima: NEGATIVE
